# Patient Record
Sex: Male | Born: 1970 | Race: Black or African American | Hispanic: No | Marital: Married | Smoking: Never smoker
Health system: Southern US, Community
[De-identification: ages and names within clinical notes are randomized; demographics above are authoritative.]

## PROBLEM LIST (undated history)

## (undated) DIAGNOSIS — Q798 Other congenital malformations of musculoskeletal system: Secondary | ICD-10-CM

## (undated) DIAGNOSIS — N200 Calculus of kidney: Secondary | ICD-10-CM

## (undated) HISTORY — DX: Calculus of kidney: N20.0

## (undated) HISTORY — PX: APPENDECTOMY: SHX54

## (undated) HISTORY — DX: Other congenital malformations of musculoskeletal system: Q79.8

---

## 2003-12-16 ENCOUNTER — Emergency Department (HOSPITAL_COMMUNITY): Admission: EM | Admit: 2003-12-16 | Discharge: 2003-12-17 | Payer: Self-pay | Admitting: *Deleted

## 2010-11-05 ENCOUNTER — Emergency Department (HOSPITAL_COMMUNITY): Payer: Self-pay

## 2010-11-05 ENCOUNTER — Emergency Department (HOSPITAL_COMMUNITY)
Admission: EM | Admit: 2010-11-05 | Discharge: 2010-11-05 | Disposition: A | Discharge status: Home / Self Care | Payer: Self-pay | Attending: Emergency Medicine | Admitting: Emergency Medicine

## 2010-11-05 DIAGNOSIS — M7750 Other enthesopathy of unspecified foot: Secondary | ICD-10-CM | POA: Insufficient documentation

## 2011-09-20 IMAGING — CR DG FOOT COMPLETE 3+V*L*
3 series · 3 of 3 positions shown · non-contrast
Comparison: None.

CLINICAL DATA: Injured left fifth digit

LEFT FOOT - COMPLETE 3+ VIEW

[view not recorded (1 of 3)]
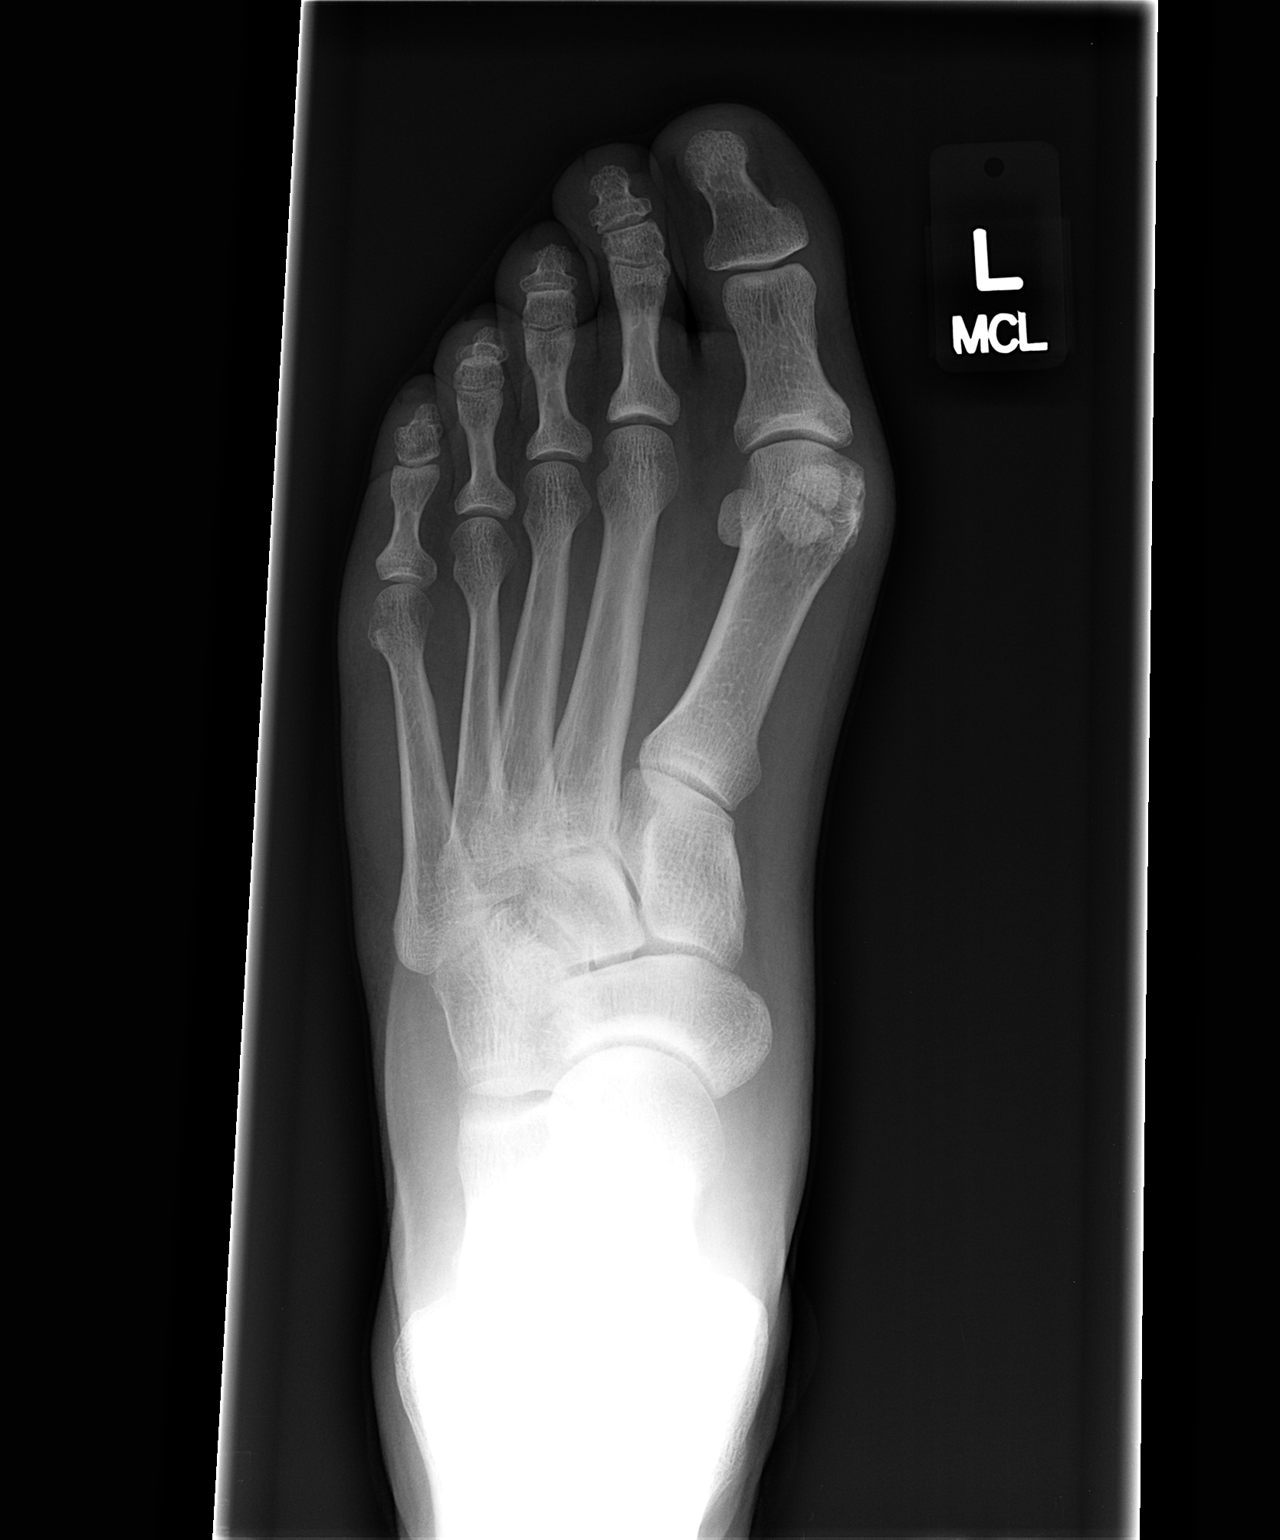

[view not recorded (2 of 3)]
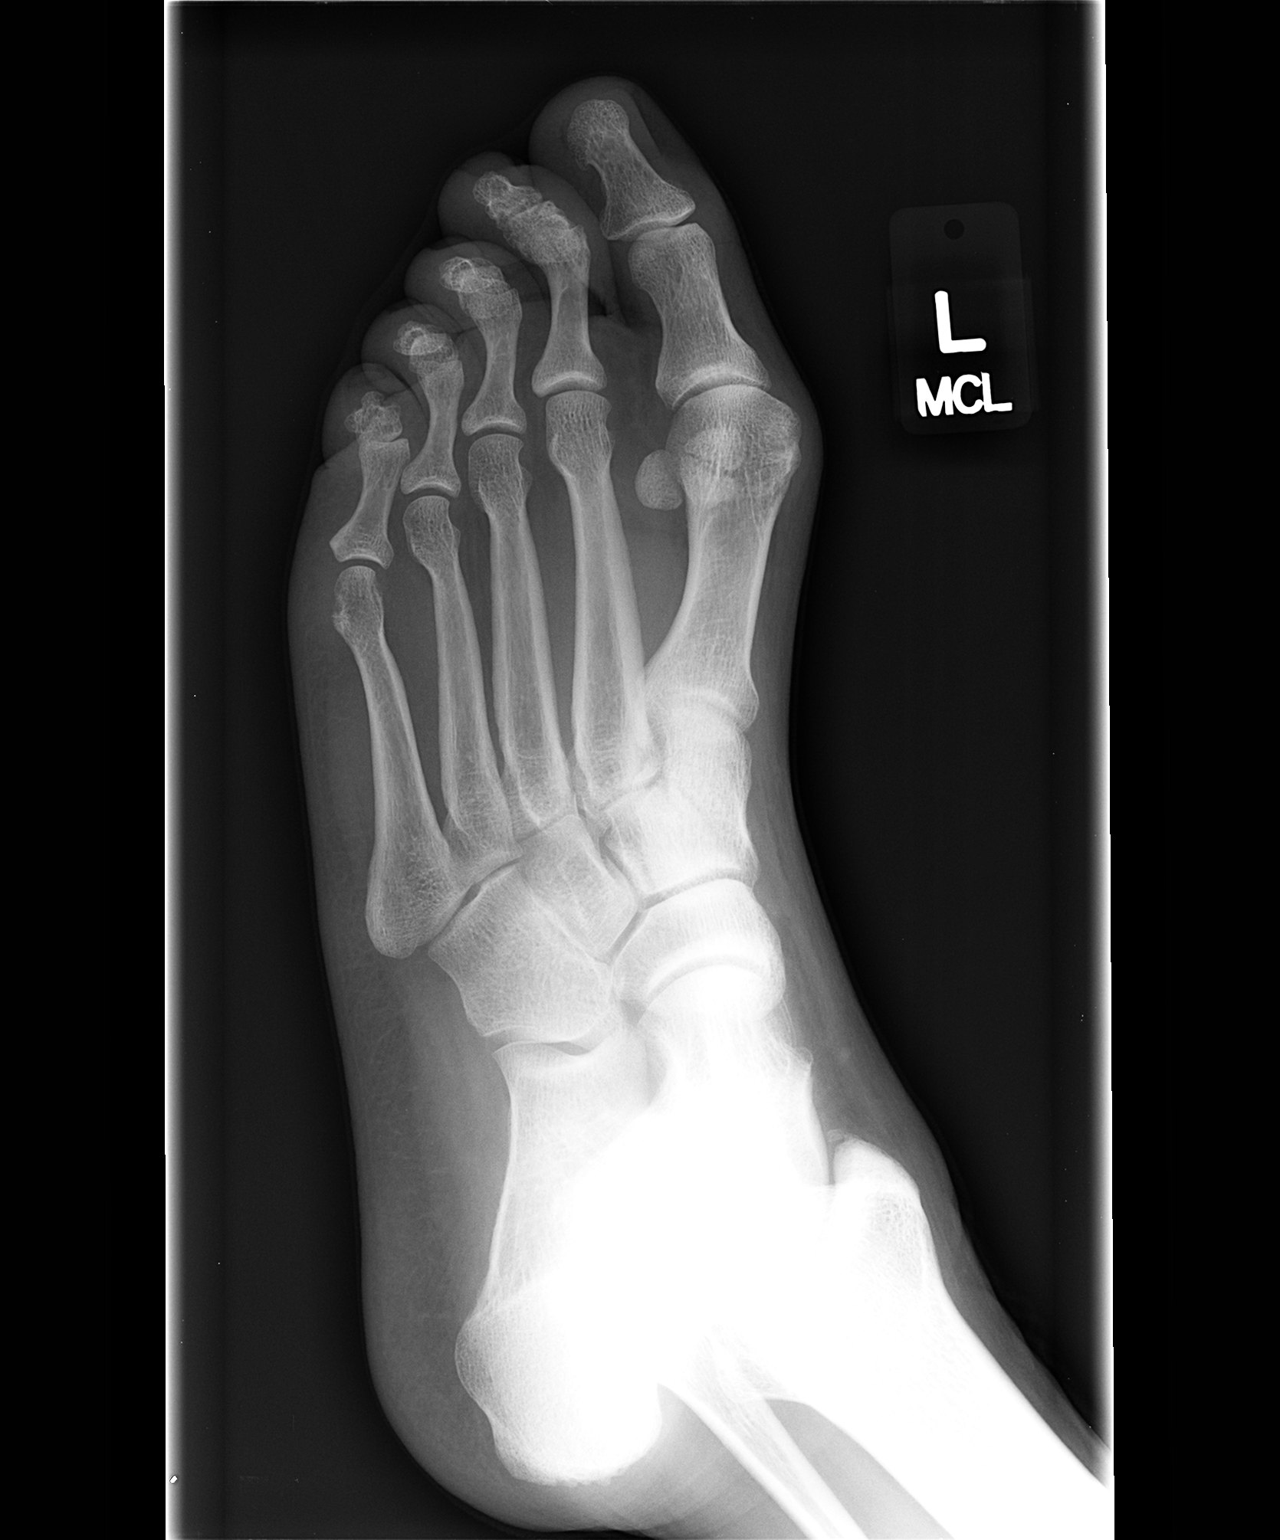

[view not recorded (3 of 3)]
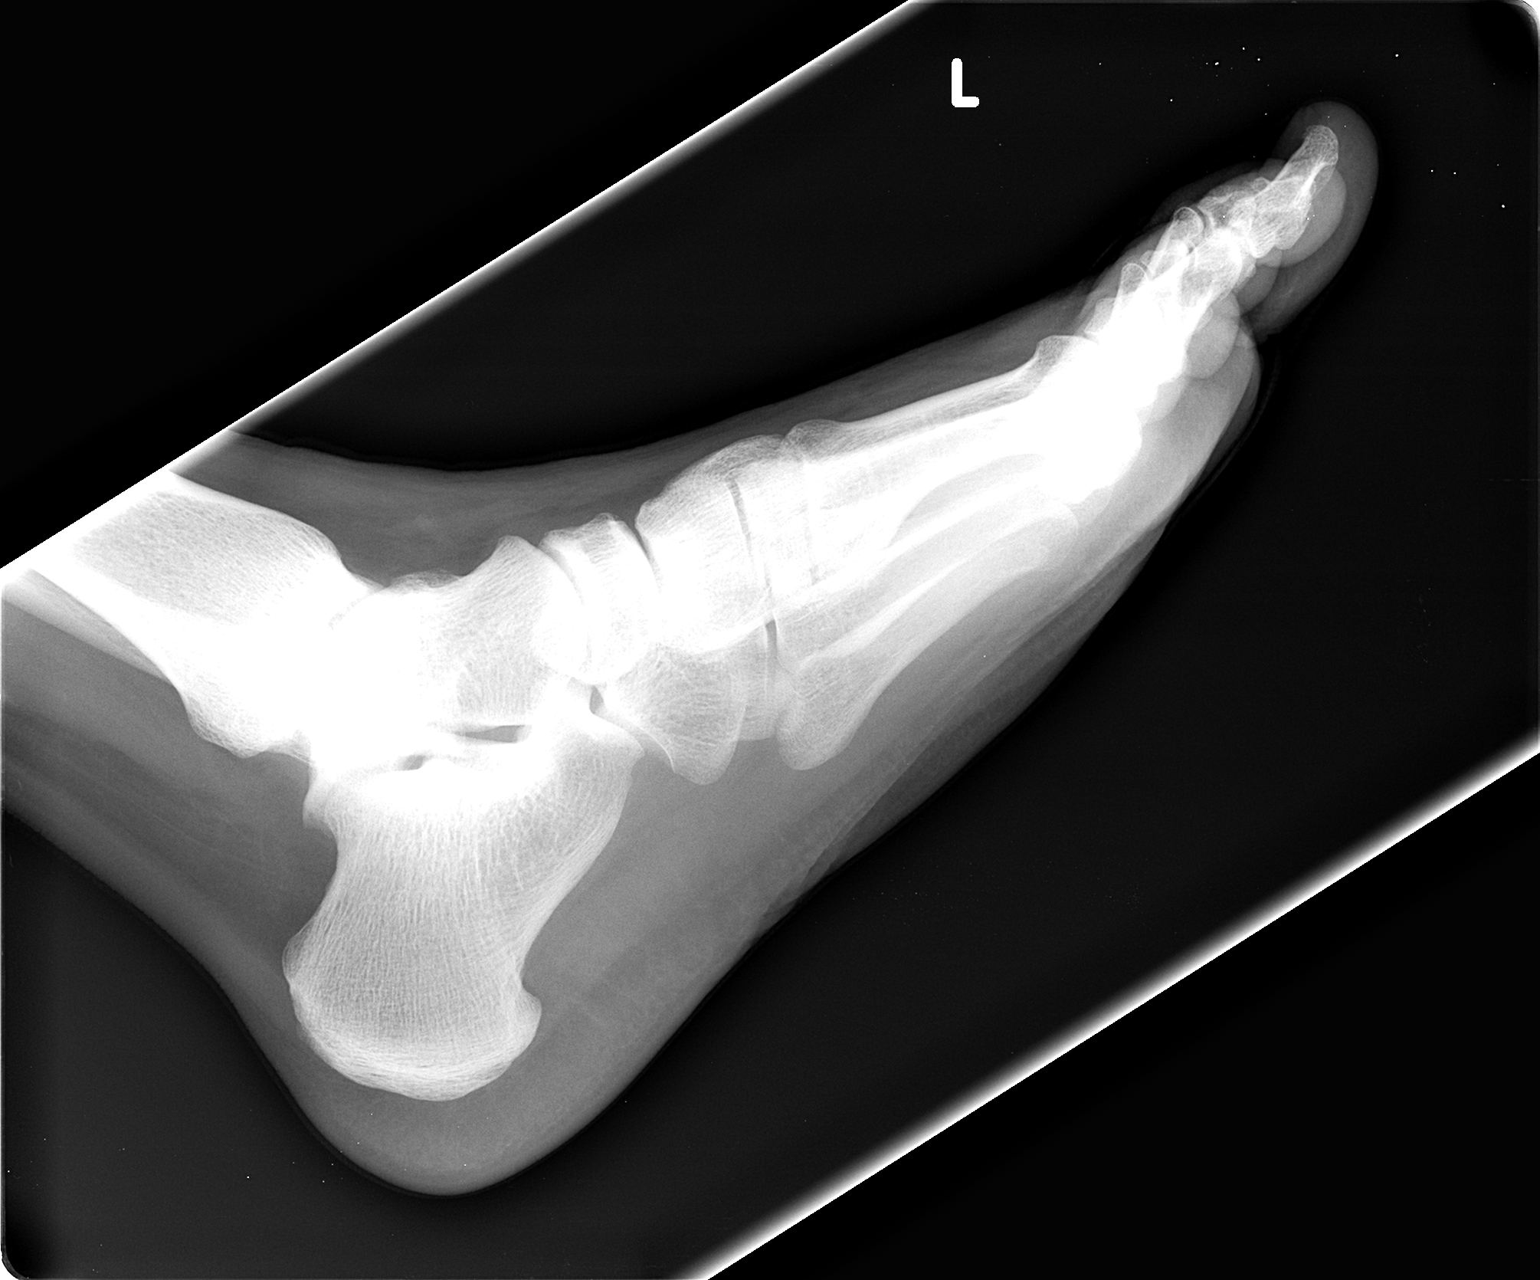

[3 of 3 positions shown; findings below may reference images not displayed]

FINDINGS: No acute fracture is seen.  Tarsal - metatarsal alignment
is normal.  Joint spaces appear normal.
IMPRESSION: Negative left foot.

## 2015-12-17 DIAGNOSIS — K529 Noninfective gastroenteritis and colitis, unspecified: Secondary | ICD-10-CM | POA: Diagnosis not present

## 2016-05-27 ENCOUNTER — Emergency Department (HOSPITAL_COMMUNITY)
Admission: EM | Admit: 2016-05-27 | Discharge: 2016-05-27 | Disposition: A | Discharge status: Home / Self Care | Payer: No Typology Code available for payment source | Attending: Emergency Medicine | Admitting: Emergency Medicine

## 2016-05-27 ENCOUNTER — Encounter (HOSPITAL_COMMUNITY): Payer: Self-pay | Admitting: Cardiology

## 2016-05-27 DIAGNOSIS — Y92410 Unspecified street and highway as the place of occurrence of the external cause: Secondary | ICD-10-CM | POA: Diagnosis not present

## 2016-05-27 DIAGNOSIS — Y9389 Activity, other specified: Secondary | ICD-10-CM | POA: Insufficient documentation

## 2016-05-27 DIAGNOSIS — S161XXA Strain of muscle, fascia and tendon at neck level, initial encounter: Secondary | ICD-10-CM | POA: Insufficient documentation

## 2016-05-27 DIAGNOSIS — Y999 Unspecified external cause status: Secondary | ICD-10-CM | POA: Diagnosis not present

## 2016-05-27 DIAGNOSIS — S199XXA Unspecified injury of neck, initial encounter: Secondary | ICD-10-CM | POA: Diagnosis present

## 2016-05-27 MED ORDER — METHOCARBAMOL 500 MG PO TABS: 500.0000 mg | ORAL_TABLET | Freq: Two times a day (BID) | ORAL | 0 refills | Status: DC
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
## 2016-05-27 NOTE — Discharge Instructions (Signed)

## 2019-01-07 ENCOUNTER — Other Ambulatory Visit: Payer: Self-pay

## 2019-01-07 ENCOUNTER — Other Ambulatory Visit: Payer: Self-pay | Admitting: Internal Medicine

## 2019-01-07 DIAGNOSIS — Z20822 Contact with and (suspected) exposure to covid-19: Secondary | ICD-10-CM

## 2019-01-10 LAB — NOVEL CORONAVIRUS, NAA: SARS-CoV-2, NAA: NOT DETECTED

## 2021-08-15 ENCOUNTER — Other Ambulatory Visit: Payer: Self-pay

## 2021-08-15 ENCOUNTER — Ambulatory Visit
Admission: RE | Admit: 2021-08-15 | Discharge: 2021-08-15 | Disposition: A | Discharge status: Home / Self Care | Payer: BC Managed Care – PPO | Source: Ambulatory Visit | Attending: Urgent Care | Admitting: Urgent Care

## 2021-08-15 VITALS — BP 155/97 | HR 71 | Temp 98.1°F | Resp 18

## 2021-08-15 DIAGNOSIS — Z7251 High risk heterosexual behavior: Secondary | ICD-10-CM | POA: Insufficient documentation

## 2021-08-15 DIAGNOSIS — R369 Urethral discharge, unspecified: Secondary | ICD-10-CM | POA: Diagnosis not present

## 2021-08-15 NOTE — ED Triage Notes (Signed)
Burning and discharge (yellow/greenish color) from penis since mid December.  Was treated with antibiotics on 12/25.

## 2021-08-15 NOTE — ED Provider Notes (Signed)
°  [place]   MRN: [id number] DOB: 1970/10/01  Subjective:   Roy Briggs is a 50 y.o. male presenting for 1+ month history of persistent intermittent penile discharge. Has 1 male partner, no condom use. No concerns about an anal or an oral infection.  He did a video visit and was treated with doxycycline and Flagyl without any kind of testing.  He was advised that if he continued to have discharge that he would need to come in for a visit. Denies dysuria, hematuria, urinary frequency, penile swelling, testicular pain, testicular swelling, anal pain, groin pain.   No current facility-administered medications for this encounter.  Current Outpatient Medications:    methocarbamol (ROBAXIN) 500 MG tablet, Take 1 tablet (500 mg total) by mouth 2 (two) times daily., Disp: 20 tablet, Rfl: 0   Allergies  Allergen Reactions   Ivp Dye [Iodinated Contrast Media] Nausea And Vomiting    No past medical history on file.   Past Surgical History:  Procedure Laterality Date   APPENDECTOMY      No family history on file.  Social History   Tobacco Use   Smoking status: Never   Smokeless tobacco: Never  Substance Use Topics   Alcohol use: No   Drug use: No    ROS   Objective:   Vitals: BP (!) 155/97 (BP Location: Right Arm)    Pulse 71    Temp 98.1 F (36.7 C) (Oral)    Resp 18    SpO2 98%   Physical Exam Constitutional:      General: He is not in acute distress.    Appearance: Normal appearance. He is well-developed and normal weight. He is not ill-appearing, toxic-appearing or diaphoretic.  HENT:     Head: Normocephalic and atraumatic.     Right Ear: External ear normal.     Left Ear: External ear normal.     Nose: Nose normal.     Mouth/Throat:     Pharynx: Oropharynx is clear.  Eyes:     General: No scleral icterus.       Right eye: No discharge.        Left eye: No discharge.     Extraocular Movements: Extraocular movements intact.   Cardiovascular:     Rate and Rhythm: Normal rate.  Pulmonary:     Effort: Pulmonary effort is normal.  Musculoskeletal:     Cervical back: Normal range of motion.  Neurological:     Mental Status: He is alert and oriented to person, place, and time.  Psychiatric:        Mood and Affect: Mood normal.        Behavior: Behavior normal.        Thought Content: Thought content normal.        Judgment: Judgment normal.    Assessment and Plan :   PDMP not reviewed this encounter.  1. Penile discharge   2. Unprotected sex    Recommended treating any infection he may have based off of the results that we get.  He was agreeable.  Abstain for now.  We will follow-up with results as soon as they are available.   Jaynee Eagles, [place] 08/15/21 [phone]

## 2021-08-17 LAB — CYTOLOGY, (ORAL, ANAL, URETHRAL) ANCILLARY ONLY
Chlamydia: NEGATIVE
Comment: NEGATIVE
Comment: NEGATIVE
Comment: NORMAL
Neisseria Gonorrhea: NEGATIVE
Trichomonas: NEGATIVE

## 2021-09-14 ENCOUNTER — Ambulatory Visit: Payer: Self-pay | Admitting: Family Medicine

## 2021-10-08 ENCOUNTER — Encounter: Payer: Self-pay | Admitting: Family Medicine

## 2021-10-08 ENCOUNTER — Ambulatory Visit: Payer: BC Managed Care – PPO | Admitting: Family Medicine

## 2021-10-08 VITALS — BP 125/82 | HR 75 | Temp 97.4°F | Ht 71.0 in | Wt 184.4 lb

## 2021-10-08 DIAGNOSIS — Z1211 Encounter for screening for malignant neoplasm of colon: Secondary | ICD-10-CM | POA: Diagnosis not present

## 2021-10-08 DIAGNOSIS — R3129 Other microscopic hematuria: Secondary | ICD-10-CM | POA: Diagnosis not present

## 2021-10-08 DIAGNOSIS — Z0001 Encounter for general adult medical examination with abnormal findings: Secondary | ICD-10-CM

## 2021-10-08 DIAGNOSIS — Z0000 Encounter for general adult medical examination without abnormal findings: Secondary | ICD-10-CM

## 2021-10-08 LAB — URINALYSIS, ROUTINE W REFLEX MICROSCOPIC
Bilirubin, UA: NEGATIVE
Glucose, UA: NEGATIVE
Ketones, UA: NEGATIVE
Leukocytes,UA: NEGATIVE
Nitrite, UA: NEGATIVE
Protein,UA: NEGATIVE
Specific Gravity, UA: >1.03 — ABNORMAL HIGH (ref 1.005–1.030)
Urobilinogen, Ur: 0.2 mg/dL (ref 0.2–1.0)
pH, UA: 5.5 (ref 5.0–7.5)

## 2021-10-08 LAB — MICROSCOPIC EXAMINATION: Renal Epithel, UA: NONE SEEN /hpf

## 2021-10-08 NOTE — Progress Notes (Addendum)
? ?Assessment & Plan:  ?1. Well adult exam ?Preventive health education provided. ?- HIV Antibody (routine testing w rflx) ?- CBC with Differential/Platelet ?- CMP14+EGFR ?- Lipid panel ?- Hepatitis C antibody (reflex, frozen specimen) ? ?2. Microscopic hematuria ?Referral placed to urology.  ?- Urinalysis, Routine w reflex microscopic -Urine dipstick shows positive for 2+ RBC's.   ? ?3. Colon cancer screening ?- Cologuard ? ? ?Return in about 1 year (around 10/09/2022) for annual physical. ? ?Hendricks Limes, MSN, APRN, FNP-C ?[place] ? ?Subjective:  ? ? Patient ID: Roy Briggs, male    DOB: [birth year], 51 y.o.   MRN: [id number] ? ?Patient Care Team: ?Loman Brooklyn, FNP as PCP - General (Family Medicine)  ? ?Chief Complaint:  ?Chief Complaint  ?Patient presents with  ? New Patient (Initial Visit)  ?  No previous PCP  ? Establish Care  ? ? ?HPI: ?Roy Briggs is a 51 y.o. male presenting on 10/08/2021 for New Patient (Initial Visit) (No previous PCP) and Establish Care ? ?Patient does not have a previous PCP. He has previously been getting physicals through work but has not had one in 4-5 years. ? ?His concern is that with those physicals, he was always told he had blood in his urine that needed to be addressed. He does not see the blood. ? ? ?Social history: ? ?Relevant past medical, surgical, family and social history reviewed and updated as indicated. Interim medical history since our last visit reviewed. ? ?Allergies and medications reviewed and updated. ? ?DATA REVIEWED: CHART IN EPIC ? ?ROS: Negative unless specifically indicated above in HPI.  ? ?No current outpatient medications on file.  ? ?Allergies  ?Allergen Reactions  ? Iodinated Contrast Media Nausea And Vomiting and Other (See Comments)  ?  IVP DYE  ? Iodine Other (See Comments)  ?  IVP DYE  ? Other Nausea And Vomiting  ? ?Past Medical History:  ?Diagnosis Date  ? Nephrolithiasis   ? [place] syndrome   ?  ?Past Surgical  History:  ?Procedure Laterality Date  ? APPENDECTOMY    ?  ?Social History  ? ?Socioeconomic History  ? Marital status: Married  ?  Spouse name: Not on file  ? Number of children: Not on file  ? Years of education: Not on file  ? Highest education level: Not on file  ?Occupational History  ? Not on file  ?Tobacco Use  ? Smoking status: Never  ? Smokeless tobacco: Never  ?Vaping Use  ? Vaping Use: Never used  ?Substance and Sexual Activity  ? Alcohol use: No  ? Drug use: No  ? Sexual activity: Yes  ?  Birth control/protection: None  ?Other Topics Concern  ? Not on file  ?Social History Narrative  ? Not on file  ? ?Social Determinants of Health  ? ?Financial Resource Strain: Not on file  ?Food Insecurity: Not on file  ?Transportation Needs: Not on file  ?Physical Activity: Not on file  ?Stress: Not on file  ?Social Connections: Not on file  ?Intimate Partner Violence: Not on file  ?  ? ?   ?Objective:  ?  ?BP 125/82   Pulse 75   Temp (!) 97.4 ?F (36.3 ?C) (Temporal)   Ht '5\' 11"'  (1.803 m)   Wt 184 lb 6.4 oz (83.6 kg)   SpO2 97%   BMI 25.72 kg/m?  ? ?Wt Readings from Last 3 Encounters:  ?10/08/21 184 lb 6.4 oz (83.6 kg)  ?05/27/16 180 lb (81.6  kg)  ? ? ?Physical Exam ?Vitals reviewed.  ?Constitutional:   ?   General: He is not in acute distress. ?   Appearance: Normal appearance. He is normal weight. He is not ill-appearing, toxic-appearing or diaphoretic.  ?HENT:  ?   Head: Normocephalic and atraumatic.  ?   Right Ear: Tympanic membrane, ear canal and external ear normal. There is no impacted cerumen.  ?   Left Ear: Tympanic membrane, ear canal and external ear normal. There is no impacted cerumen.  ?   Nose: Nose normal. No congestion or rhinorrhea.  ?   Mouth/Throat:  ?   Mouth: Mucous membranes are moist.  ?   Pharynx: Oropharynx is clear. No oropharyngeal exudate or posterior oropharyngeal erythema.  ?Eyes:  ?   General: No scleral icterus.    ?   Right eye: No discharge.     ?   Left eye: No discharge.  ?    Conjunctiva/sclera: Conjunctivae normal.  ?   Pupils: Pupils are equal, round, and reactive to light.  ?Neck:  ?   Vascular: No carotid bruit.  ?Cardiovascular:  ?   Rate and Rhythm: Normal rate and regular rhythm.  ?   Heart sounds: Normal heart sounds. No murmur heard. ?  No friction rub. No gallop.  ?Pulmonary:  ?   Effort: Pulmonary effort is normal. No respiratory distress.  ?   Breath sounds: Normal breath sounds. No stridor. No wheezing, rhonchi or rales.  ?Abdominal:  ?   General: Abdomen is flat. Bowel sounds are normal. There is no distension.  ?   Palpations: Abdomen is soft. There is no hepatomegaly, splenomegaly or mass.  ?   Tenderness: There is no abdominal tenderness. There is no guarding or rebound.  ?   Hernia: No hernia is present.  ?Musculoskeletal:     ?   General: Normal range of motion.  ?   Cervical back: Normal range of motion and neck supple. No rigidity. No muscular tenderness.  ?   Right lower leg: No edema.  ?   Left lower leg: No edema.  ?Lymphadenopathy:  ?   Cervical: No cervical adenopathy.  ?Skin: ?   General: Skin is warm and dry.  ?   Capillary Refill: Capillary refill takes less than 2 seconds.  ?Neurological:  ?   General: No focal deficit present.  ?   Mental Status: He is alert and oriented to person, place, and time. Mental status is at baseline.  ?Psychiatric:     ?   Mood and Affect: Mood normal.     ?   Behavior: Behavior normal.     ?   Thought Content: Thought content normal.     ?   Judgment: Judgment normal.  ? ? ?No results found for: TSH ?No results found for: WBC, HGB, HCT, MCV, PLT ?No results found for: NA, K, CHLORIDE, CO2, GLUCOSE, BUN, CREATININE, BILITOT, ALKPHOS, AST, ALT, PROT, ALBUMIN, CALCIUM, ANIONGAP, EGFR, GFR ?No results found for: CHOL ?No results found for: HDL ?No results found for: [place] ?No results found for: TRIG ?No results found for: CHOLHDL ?No results found for: HGBA1C ? ?   ? ? ? ? ?

## 2021-10-08 NOTE — Addendum Note (Signed)
Addended by: Hendricks Limes F on: 10/08/2021 01:59 PM ? ? Modules accepted: Orders ? ?

## 2021-10-09 LAB — CMP14+EGFR
ALT: 27 IU/L (ref 0–44)
AST: 20 IU/L (ref 0–40)
Albumin/Globulin Ratio: 2 (ref 1.2–2.2)
Albumin: 4.8 g/dL (ref 3.8–4.9)
Alkaline Phosphatase: 98 IU/L (ref 44–121)
BUN/Creatinine Ratio: 8 — ABNORMAL LOW (ref 9–20)
BUN: 10 mg/dL (ref 6–24)
Bilirubin Total: 0.5 mg/dL (ref 0.0–1.2)
CO2: 22 mmol/L (ref 20–29)
Calcium: 10.1 mg/dL (ref 8.7–10.2)
Chloride: 101 mmol/L (ref 96–106)
Creatinine, Ser: 1.22 mg/dL (ref 0.76–1.27)
Globulin, Total: 2.4 g/dL (ref 1.5–4.5)
Glucose: 112 mg/dL — ABNORMAL HIGH (ref 70–99)
Potassium: 3.8 mmol/L (ref 3.5–5.2)
Sodium: 138 mmol/L (ref 134–144)
Total Protein: 7.2 g/dL (ref 6.0–8.5)
eGFR: 72 mL/min/{1.73_m2} (ref >59)

## 2021-10-09 LAB — CBC WITH DIFFERENTIAL/PLATELET
Basophils Absolute: 0 10*3/uL (ref 0.0–0.2)
Basos: 0 %
EOS (ABSOLUTE): 0.1 10*3/uL (ref 0.0–0.4)
Eos: 2 %
Hematocrit: 44.5 % (ref 37.5–51.0)
Hemoglobin: 14 g/dL (ref 13.0–17.7)
Immature Grans (Abs): 0 10*3/uL (ref 0.0–0.1)
Immature Granulocytes: 0 %
Lymphocytes Absolute: 1.5 10*3/uL (ref 0.7–3.1)
Lymphs: 36 %
MCH: 23.9 pg — ABNORMAL LOW (ref 26.6–33.0)
MCHC: 31.5 g/dL (ref 31.5–35.7)
MCV: 76 fL — ABNORMAL LOW (ref 79–97)
Monocytes Absolute: 0.3 10*3/uL (ref 0.1–0.9)
Monocytes: 8 %
Neutrophils Absolute: 2.2 10*3/uL (ref 1.4–7.0)
Neutrophils: 54 %
Platelets: 187 10*3/uL (ref 150–450)
RBC: 5.87 x10E6/uL — ABNORMAL HIGH (ref 4.14–5.80)
RDW: 14.6 % (ref 11.6–15.4)
WBC: 4.1 10*3/uL (ref 3.4–10.8)

## 2021-10-09 LAB — LIPID PANEL
Chol/HDL Ratio: 5 ratio (ref 0.0–5.0)
Cholesterol, Total: 196 mg/dL (ref 100–199)
HDL: 39 mg/dL — ABNORMAL LOW (ref >39)
LDL Chol Calc (NIH): 126 mg/dL — ABNORMAL HIGH (ref 0–99)
Triglycerides: 171 mg/dL — ABNORMAL HIGH (ref 0–149)
VLDL Cholesterol Cal: 31 mg/dL (ref 5–40)

## 2021-10-09 LAB — HCV INTERPRETATION

## 2021-10-09 LAB — HCV AB W REFLEX TO QUANT PCR: HCV Ab: NONREACTIVE

## 2021-10-09 LAB — HIV ANTIBODY (ROUTINE TESTING W REFLEX): HIV Screen 4th Generation wRfx: NONREACTIVE

## 2021-11-02 ENCOUNTER — Encounter: Payer: Self-pay | Admitting: Urology

## 2021-11-02 ENCOUNTER — Ambulatory Visit: Payer: BC Managed Care – PPO | Admitting: Urology

## 2021-11-02 VITALS — BP 133/80 | HR 74

## 2021-11-02 DIAGNOSIS — Z125 Encounter for screening for malignant neoplasm of prostate: Secondary | ICD-10-CM

## 2021-11-02 DIAGNOSIS — R319 Hematuria, unspecified: Secondary | ICD-10-CM | POA: Diagnosis not present

## 2021-11-02 DIAGNOSIS — R3129 Other microscopic hematuria: Secondary | ICD-10-CM | POA: Diagnosis not present

## 2021-11-02 NOTE — Progress Notes (Signed)
? ?Assessment: ?1. Hematuria, dipstick only   ?2. Prostate cancer screening   ? ? ?Plan: ?Today I had a discussion with the patient regarding the findings of  dipstick  hematuria including the implications and differential diagnoses associated with it.  I also discussed recommendations for further evaluation including the rationale for upper tract imaging and cystoscopy.  I discussed the nature of these procedures including potential risk and complications.  The patient expressed an understanding of these issues. ?I advised him that the findings of microscopic hematuria on a microscopic analysis are the basis for further evaluation.  At the present time, his microscopic urinalysis is negative for blood. ?PSA today. ?Return for repeat urinalysis in 3-4 weeks. ? ? ?Chief Complaint:  ?Chief Complaint  ?Patient presents with  ? Hematuria  ? ? ?History of Present Illness: ? ?Roy Briggs is a 51 y.o. year old male who is seen in consultation from Loman Brooklyn, FNP for evaluation of hematuria.  He has been previously told that he has blood in his urine during work physicals.  No recent gross hematuria. ?Urinalysis from 10/08/2021 showed 2+ blood on dipstick and 0-2 RBCs on microscopic examination. ?He was recently treated for a UTI via telemedicine.  No urinalysis was obtained.  He was having some dysuria.  His symptoms resolved following treatment. ?He has a history of kidney stones with his last stone episode in 2014.  He did experience some right-sided flank pain last week which has resolved. ?No recent PSA testing. ?History of tobacco use. ?No significant lower urinary tract symptoms. ?IPSS = 3 today. ? ? ?Past Medical History:  ?Past Medical History:  ?Diagnosis Date  ? Nephrolithiasis   ? [place] syndrome   ? ? ?Past Surgical History:  ?Past Surgical History:  ?Procedure Laterality Date  ? APPENDECTOMY    ? ? ?Allergies:  ?Allergies  ?Allergen Reactions  ? Iodinated Contrast Media Nausea And Vomiting and  Other (See Comments)  ?  IVP DYE  ? Iodine Other (See Comments)  ?  IVP DYE  ? Other Nausea And Vomiting  ? ? ?Family History:  ?Family History  ?Problem Relation Age of Onset  ? Ovarian cancer Mother   ? COPD Maternal Grandfather   ? ? ?Social History:  ?Social History  ? ?Tobacco Use  ? Smoking status: Never  ? Smokeless tobacco: Never  ?Vaping Use  ? Vaping Use: Never used  ?Substance Use Topics  ? Alcohol use: No  ? Drug use: No  ? ? ?Review of symptoms:  ?Constitutional:  Negative for unexplained weight loss, night sweats, fever, chills ?ENT:  Negative for nose bleeds, sinus pain, painful swallowing ?CV:  Negative for chest pain, shortness of breath, exercise intolerance, palpitations, loss of consciousness ?Resp:  Negative for cough, wheezing, shortness of breath ?GI:  Negative for nausea, vomiting, diarrhea, bloody stools ?GU:  Positives noted in HPI; otherwise negative for gross hematuria,  urinary incontinence ?Neuro:  Negative for seizures, poor balance, limb weakness, slurred speech ?Psych:  Negative for lack of energy, depression, anxiety ?Endocrine:  Negative for polydipsia, polyuria, symptoms of hypoglycemia (dizziness, hunger, sweating) ?Hematologic:  Negative for anemia, purpura, petechia, prolonged or excessive bleeding, use of anticoagulants  ?Allergic:  Negative for difficulty breathing or choking as a result of exposure to anything; no shellfish allergy; no allergic response (rash/itch) to materials, foods ? ?Physical exam: ?BP 133/80   Pulse 74  ?GENERAL APPEARANCE:  Well appearing, well developed, well nourished, NAD ?HEENT: Atraumatic, Normocephalic, oropharynx clear. ?NECK:  Supple without lymphadenopathy or thyromegaly. ?LUNGS: Clear to auscultation bilaterally. ?HEART: Regular Rate and Rhythm without murmurs, gallops, or rubs. ?ABDOMEN: Soft, non-tender, No Masses. ?EXTREMITIES: Moves all extremities well.  Without clubbing, cyanosis, or edema. ?NEUROLOGIC:  Alert and oriented x 3, normal  gait, CN II-XII grossly intact.  ?MENTAL STATUS:  Appropriate. ?BACK:  Non-tender to palpation.  No CVAT ?SKIN:  Warm, dry and intact.   ?GU: ?Penis:  uncircumcised ?Meatus: Normal ?Scrotum: normal, no masses ?Testis: normal without masses bilateral ?Epididymis: normal ?Prostate: 40 g, NT, no nodules ?Rectum: Normal tone,  no masses or tenderness ? ? ?Results: ?U/A:  trace blood, 0-2 RBC ? ?

## 2021-11-03 LAB — URINALYSIS, ROUTINE W REFLEX MICROSCOPIC
Bilirubin, UA: NEGATIVE
Glucose, UA: NEGATIVE
Ketones, UA: NEGATIVE
Leukocytes,UA: NEGATIVE
Nitrite, UA: NEGATIVE
Protein,UA: NEGATIVE
Specific Gravity, UA: 1.025 (ref 1.005–1.030)
Urobilinogen, Ur: 1 mg/dL (ref 0.2–1.0)
pH, UA: 7 (ref 5.0–7.5)

## 2021-11-03 LAB — MICROSCOPIC EXAMINATION
Bacteria, UA: NONE SEEN
Epithelial Cells (non renal): NONE SEEN /hpf (ref 0–10)
RBC: NONE SEEN /hpf (ref 0–2)
Renal Epithel, UA: NONE SEEN /hpf
WBC, UA: NONE SEEN /hpf (ref 0–5)

## 2021-11-03 LAB — PSA: Prostate Specific Ag, Serum: 3.1 ng/mL (ref 0.0–4.0)

## 2021-11-08 DIAGNOSIS — Z1211 Encounter for screening for malignant neoplasm of colon: Secondary | ICD-10-CM | POA: Diagnosis not present

## 2021-11-19 LAB — COLOGUARD: COLOGUARD: NEGATIVE

## 2021-11-30 ENCOUNTER — Other Ambulatory Visit: Payer: BC Managed Care – PPO

## 2023-06-20 ENCOUNTER — Encounter: Payer: Self-pay | Admitting: Urology
